# Patient Record
Sex: Male | Born: 1995 | Race: White | Hispanic: No | Marital: Single | Smoking: Never smoker
Health system: Southern US, Community
[De-identification: ages and names within clinical notes are randomized; demographics above are authoritative.]

---

## 2013-08-10 ENCOUNTER — Ambulatory Visit: Payer: Self-pay | Admitting: Family Medicine

## 2013-08-14 ENCOUNTER — Ambulatory Visit: Payer: Self-pay | Admitting: Family Medicine

## 2013-12-14 ENCOUNTER — Emergency Department: Payer: Self-pay | Admitting: Emergency Medicine

## 2015-08-05 IMAGING — CR RIGHT HAND - COMPLETE 3+ VIEW
1 series · 3 of 3 positions shown · non-contrast
Comparison: None.

CLINICAL DATA: Third MCP joint pain after work injury.

EXAM:
RIGHT HAND - COMPLETE 3+ VIEW

[Series 1: pa · 0.17mm/px · 3 of 3 slices shown]
[im 1/3]
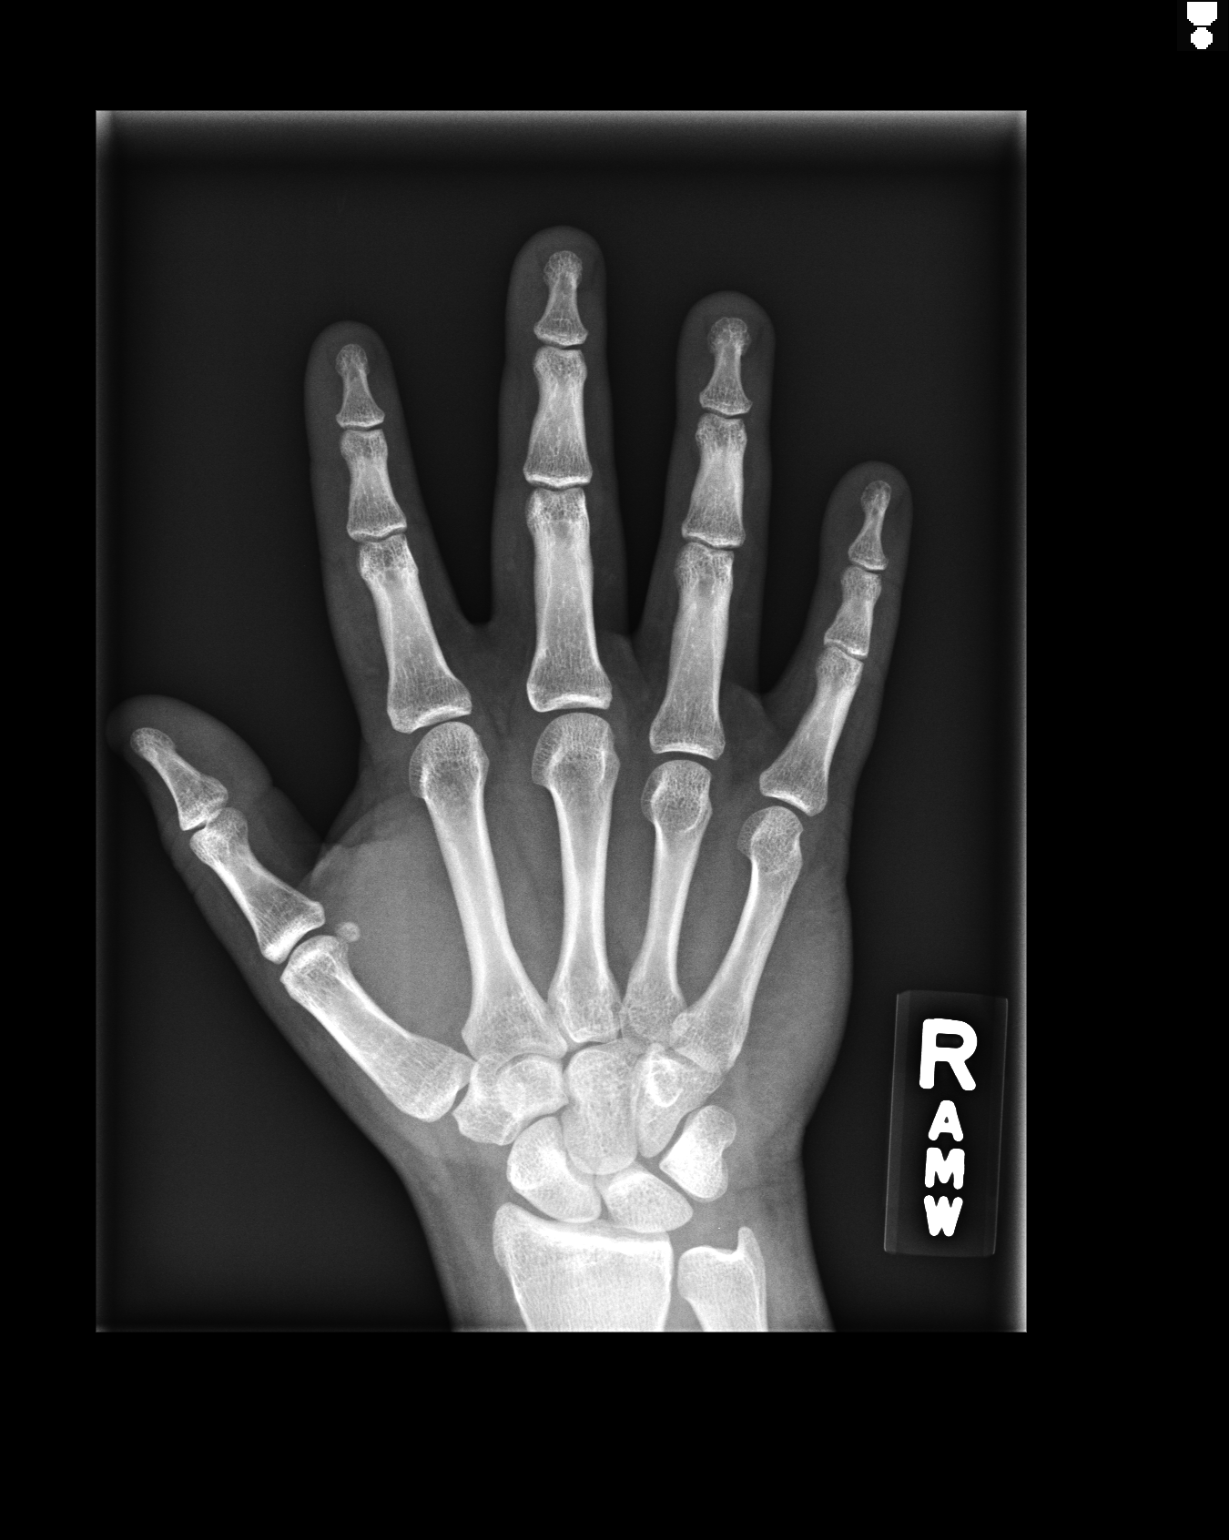
[im 2/3]
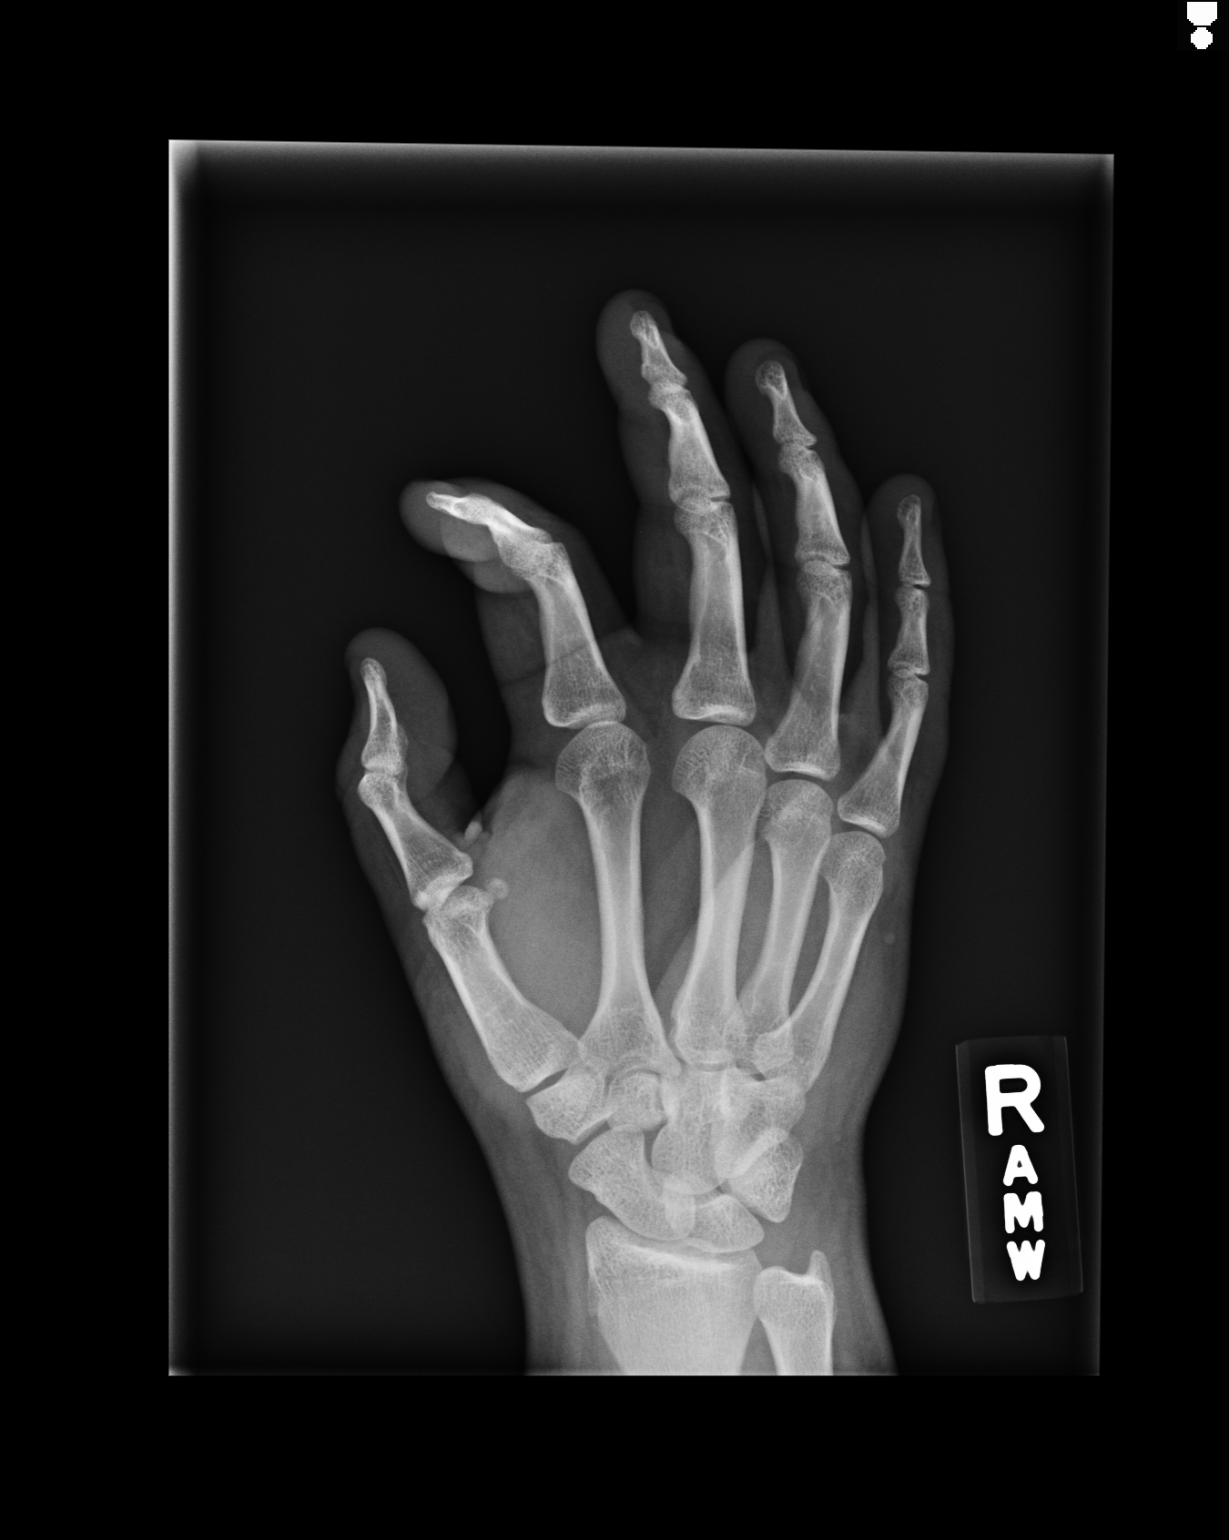
[im 3/3]
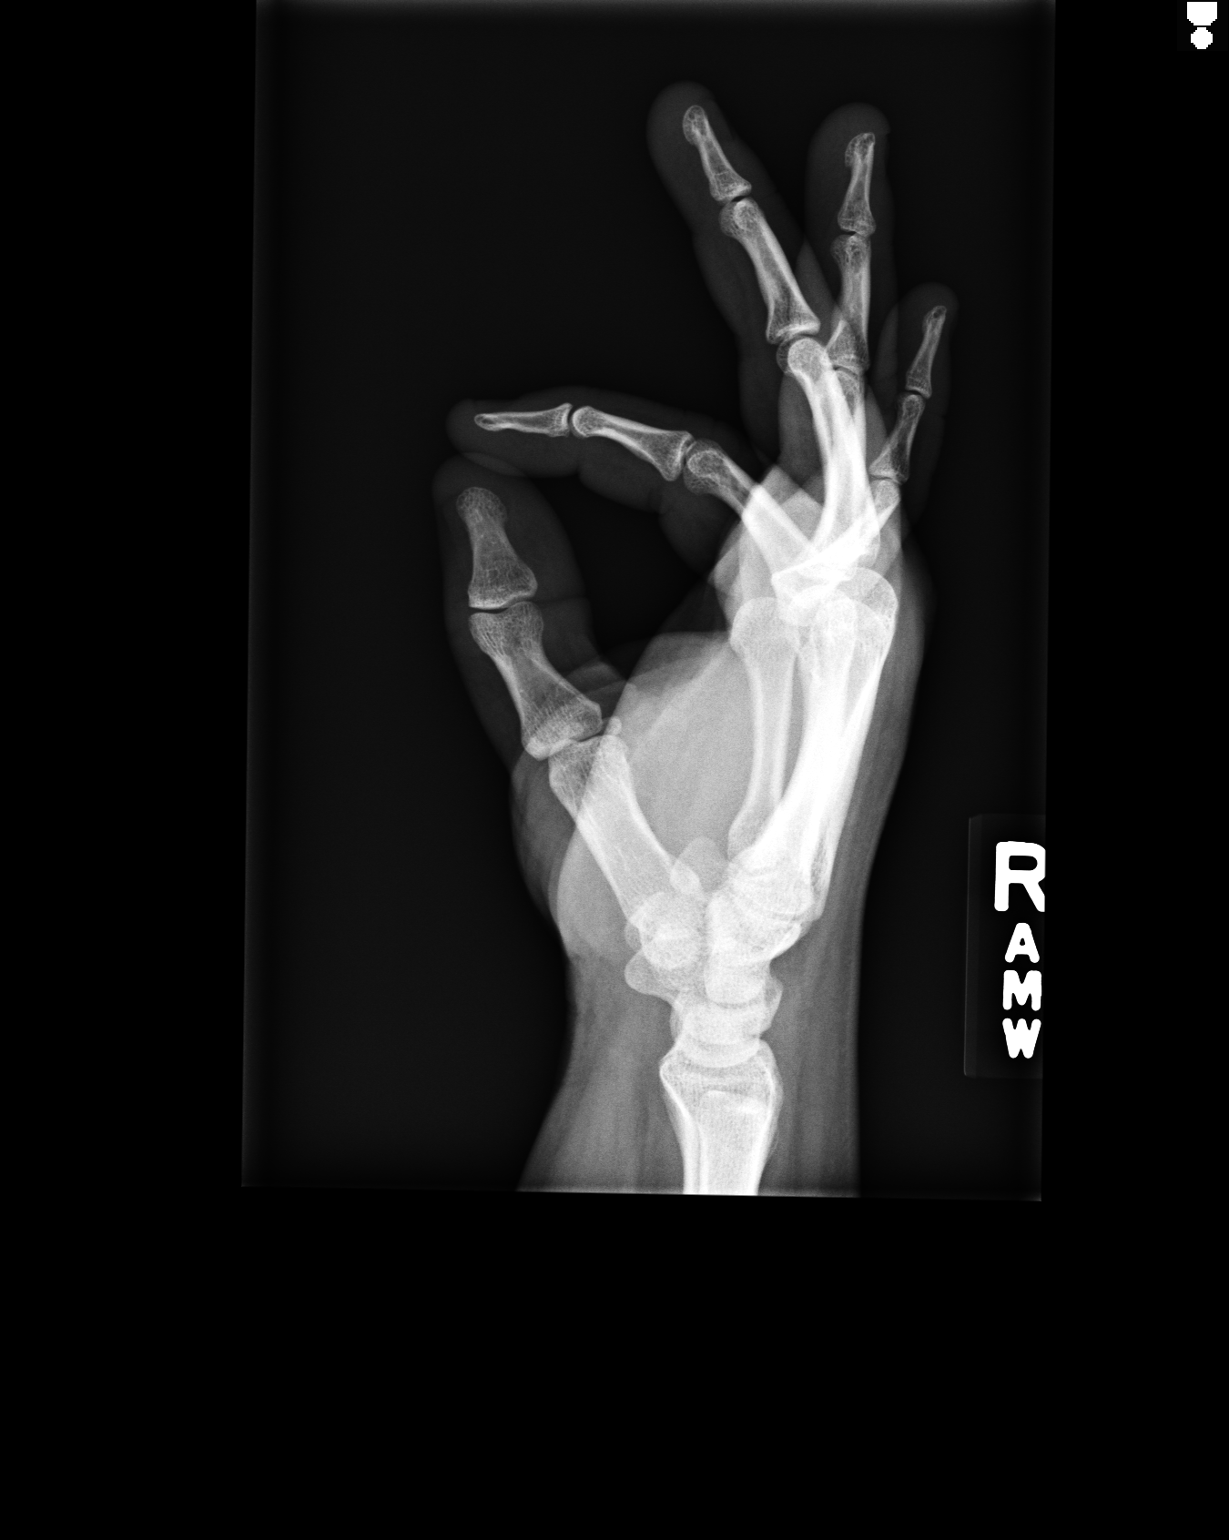

[3 of 3 positions shown; findings below may reference images not displayed]

FINDINGS: No fracture, foreign body, dislocation, or acute bony findings
observed.
IMPRESSION: No acute bony findings.

## 2022-06-22 ENCOUNTER — Ambulatory Visit
Admission: EM | Admit: 2022-06-22 | Discharge: 2022-06-22 | Disposition: A | Discharge status: Home / Self Care | Payer: BLUE CROSS/BLUE SHIELD | Attending: Family Medicine | Admitting: Family Medicine

## 2022-06-22 DIAGNOSIS — R590 Localized enlarged lymph nodes: Secondary | ICD-10-CM | POA: Diagnosis not present

## 2022-06-22 MED ORDER — AMOXICILLIN-POT CLAVULANATE 875-125 MG PO TABS: 1.0000 | ORAL_TABLET | Freq: Two times a day (BID) | ORAL | 0 refills | Status: AC

## 2022-06-22 NOTE — ED Provider Notes (Signed)
[facility]    CSN: [id number] Arrival date & time: 06/22/22  [id number]      History   Chief Complaint Chief Complaint  Patient presents with   neck swelling    HPI Justin Moss is a 27 y.o. male.   Presenting today with 3-day history of progressively worsening neck swelling, tenderness on the right below the ear down into neck.  Slight sore throat, right ear pain additionally.  Denies fever, chills, body aches, night sweats, unexpected weight loss.  Had a flulike illness that ended over a week and a half ago and states he was in his usual state of health prior to the symptoms.  So far tried warm compresses, ibuprofen with minimal relief.    History reviewed. No pertinent past medical history.  There are no problems to display for this patient.   History reviewed. No pertinent surgical history.     Home Medications    Prior to Admission medications   Medication Sig Start Date End Date Taking? Authorizing Provider  amoxicillin-clavulanate (AUGMENTIN) 875-125 MG tablet Take 1 tablet by mouth every 12 (twelve) hours. 06/22/22  Yes Volney American, PA-C    Family History History reviewed. No pertinent family history.  Social History Social History   Tobacco Use   Smoking status: Never   Smokeless tobacco: Never  Substance Use Topics   Alcohol use: Never   Drug use: Never     Allergies   Patient has no allergy information on record.   Review of Systems Review of Systems Per HPI  Physical Exam Triage Vital Signs ED Triage Vitals [06/22/22 0955]  Enc Vitals Group     BP (!) 142/75     Pulse Rate 92     Resp 17     Temp 98.7 F (37.1 C)     Temp Source Oral     SpO2 97 %     Weight      Height      Head Circumference      Peak Flow      Pain Score 6     Pain Loc      Pain Edu?      Excl. in [place]?    No data found.  Updated Vital Signs BP (!) 142/75 (BP Location: Right Arm)   Pulse 92   Temp 98.7 F (37.1 C) (Oral)    Resp 17   SpO2 97%   Visual Acuity Right Eye Distance:   Left Eye Distance:   Bilateral Distance:    Right Eye Near:   Left Eye Near:    Bilateral Near:     Physical Exam Vitals and nursing note reviewed.  Constitutional:      Appearance: Normal appearance.  HENT:     Head: Atraumatic.     Right Ear: Tympanic membrane normal.     Left Ear: Tympanic membrane normal.     Nose: Nose normal.     Mouth/Throat:     Mouth: Mucous membranes are moist.     Pharynx: Oropharynx is clear. No oropharyngeal exudate or posterior oropharyngeal erythema.     Comments: Uvula midline, oral airway patent Eyes:     Extraocular Movements: Extraocular movements intact.     Conjunctiva/sclera: Conjunctivae normal.  Neck:     Comments: Large firm and tender focal cervical adenopathy on the right, nonfluctuant, not erythematous Cardiovascular:     Rate and Rhythm: Normal rate and regular rhythm.  Pulmonary:     Effort:  Pulmonary effort is normal.     Breath sounds: Normal breath sounds.  Musculoskeletal:        General: Normal range of motion.     Cervical back: Normal range of motion and neck supple.  Lymphadenopathy:     Cervical: Cervical adenopathy present.  Skin:    General: Skin is warm and dry.  Neurological:     General: No focal deficit present.     Mental Status: He is oriented to person, place, and time.  Psychiatric:        Mood and Affect: Mood normal.        Thought Content: Thought content normal.        Judgment: Judgment normal.      UC Treatments / Results  Labs (all labs ordered are listed, but only abnormal results are displayed) Labs Reviewed - No data to display  EKG   Radiology No results found.  Procedures Procedures (including critical care time)  Medications Ordered in UC Medications - No data to display  Initial Impression / Assessment and Plan / UC Course  I have reviewed the triage vital signs and the nursing notes.  Pertinent labs & imaging  results that were available during my care of the patient were reviewed by me and considered in my medical decision making (see chart for details).     Overall vitals and exam reassuring, airway patent and without any respiratory distress.  Given the size and progression of symptoms, will cover with Augmentin in case secondary bacterial issue post influenza.  Continue warm compresses, ibuprofen and close monitoring.  PCP follow-up if not resolving.  ED for significantly worsening symptoms.  Final Clinical Impressions(s) / UC Diagnoses   Final diagnoses:  Cervical adenopathy   Discharge Instructions   None    ED Prescriptions     Medication Sig Dispense Auth. Provider   amoxicillin-clavulanate (AUGMENTIN) 875-125 MG tablet Take 1 tablet by mouth every 12 (twelve) hours. 14 tablet Volney American, [place]      PDMP not reviewed this encounter.   Volney American, [place] 06/22/22 1023

## 2022-06-22 NOTE — ED Triage Notes (Signed)
Pt states he think he had he flu last week, partner was positive for the flu and he had the same symptoms now his neck is swollen on the right side bellow right ear, that started 3 days ago. Does have slight sore throat, and right ear pain. Taking ibuprofen.
